# Patient Record
Sex: Female | Born: 1935 | Race: White | Hispanic: No
Health system: Southern US, Community
[De-identification: ages and names within clinical notes are randomized; demographics above are authoritative.]

---

## 1997-09-23 ENCOUNTER — Ambulatory Visit (HOSPITAL_COMMUNITY): Admission: RE | Admit: 1997-09-23 | Discharge: 1997-09-24 | Payer: Self-pay | Admitting: *Deleted

## 1998-06-03 ENCOUNTER — Ambulatory Visit (HOSPITAL_COMMUNITY): Admission: RE | Admit: 1998-06-03 | Discharge: 1998-06-03 | Payer: Self-pay | Admitting: *Deleted

## 1998-06-13 ENCOUNTER — Encounter: Admission: RE | Admit: 1998-06-13 | Discharge: 1998-09-11 | Payer: Self-pay | Admitting: Family Medicine

## 1998-07-06 ENCOUNTER — Ambulatory Visit (HOSPITAL_COMMUNITY): Admission: RE | Admit: 1998-07-06 | Discharge: 1998-07-06 | Payer: Self-pay | Admitting: *Deleted

## 1998-09-12 ENCOUNTER — Encounter: Admission: RE | Admit: 1998-09-12 | Discharge: 1998-12-11 | Payer: Self-pay | Admitting: Radiation Oncology

## 1998-12-13 ENCOUNTER — Encounter: Admission: RE | Admit: 1998-12-13 | Discharge: 1999-03-13 | Payer: Self-pay | Admitting: Radiation Oncology

## 1999-01-31 ENCOUNTER — Ambulatory Visit (HOSPITAL_COMMUNITY): Admission: RE | Admit: 1999-01-31 | Discharge: 1999-01-31 | Payer: Self-pay | Admitting: Pulmonary Disease

## 2000-02-01 ENCOUNTER — Encounter: Payer: Self-pay | Admitting: General Surgery

## 2000-02-01 ENCOUNTER — Ambulatory Visit (HOSPITAL_COMMUNITY): Admission: RE | Admit: 2000-02-01 | Discharge: 2000-02-01 | Payer: Self-pay | Admitting: General Surgery

## 2001-02-03 ENCOUNTER — Encounter: Payer: Self-pay | Admitting: General Surgery

## 2001-02-03 ENCOUNTER — Ambulatory Visit (HOSPITAL_COMMUNITY): Admission: RE | Admit: 2001-02-03 | Discharge: 2001-02-03 | Payer: Self-pay | Admitting: General Surgery

## 2002-02-10 ENCOUNTER — Encounter: Admission: RE | Admit: 2002-02-10 | Discharge: 2002-02-10 | Payer: Self-pay | Admitting: General Surgery

## 2002-02-10 ENCOUNTER — Encounter: Payer: Self-pay | Admitting: General Surgery

## 2003-02-12 ENCOUNTER — Encounter: Admission: RE | Admit: 2003-02-12 | Discharge: 2003-02-12 | Payer: Self-pay | Admitting: General Surgery

## 2003-02-12 ENCOUNTER — Encounter: Payer: Self-pay | Admitting: General Surgery

## 2004-02-15 ENCOUNTER — Encounter: Admission: RE | Admit: 2004-02-15 | Discharge: 2004-02-15 | Payer: Self-pay | Admitting: General Surgery

## 2005-03-16 ENCOUNTER — Encounter: Admission: RE | Admit: 2005-03-16 | Discharge: 2005-03-16 | Payer: Self-pay | Admitting: Internal Medicine

## 2005-10-27 ENCOUNTER — Ambulatory Visit: Payer: Self-pay | Admitting: Internal Medicine

## 2006-03-22 ENCOUNTER — Encounter: Admission: RE | Admit: 2006-03-22 | Discharge: 2006-03-22 | Payer: Self-pay | Admitting: Internal Medicine

## 2012-02-21 ENCOUNTER — Inpatient Hospital Stay: Payer: Self-pay | Admitting: Orthopedic Surgery

## 2012-02-21 LAB — CBC WITH DIFFERENTIAL/PLATELET
Basophil #: 0 10*3/uL (ref 0.0–0.1)
Basophil %: 0.2 %
Eosinophil #: 0 10*3/uL (ref 0.0–0.7)
Eosinophil %: 0 %
HCT: 36.3 % (ref 35.0–47.0)
HGB: 11.9 g/dL — ABNORMAL LOW (ref 12.0–16.0)
Lymphocyte #: 0.5 10*3/uL — ABNORMAL LOW (ref 1.0–3.6)
Lymphocyte %: 2.9 %
MCH: 31.4 pg (ref 26.0–34.0)
MCHC: 32.7 g/dL (ref 32.0–36.0)
MCV: 96 fL (ref 80–100)
Monocyte #: 1.2 x10 3/mm — ABNORMAL HIGH (ref 0.2–0.9)
Monocyte %: 6.2 %
Neutrophil #: 16.9 10*3/uL — ABNORMAL HIGH (ref 1.4–6.5)
Neutrophil %: 90.7 %
Platelet: 258 10*3/uL (ref 150–440)
RBC: 3.78 10*6/uL — ABNORMAL LOW (ref 3.80–5.20)
RDW: 12.7 % (ref 11.5–14.5)
WBC: 18.6 10*3/uL — ABNORMAL HIGH (ref 3.6–11.0)

## 2012-02-21 LAB — BASIC METABOLIC PANEL
Anion Gap: 11 (ref 7–16)
BUN: 32 mg/dL — ABNORMAL HIGH (ref 7–18)
Calcium, Total: 8.8 mg/dL (ref 8.5–10.1)
Chloride: 106 mmol/L (ref 98–107)
Co2: 22 mmol/L (ref 21–32)
Creatinine: 1.79 mg/dL — ABNORMAL HIGH (ref 0.60–1.30)
EGFR (African American): 30 — ABNORMAL LOW
EGFR (Non-African Amer.): 26 — ABNORMAL LOW
Glucose: 150 mg/dL — ABNORMAL HIGH (ref 65–99)
Osmolality: 287 (ref 275–301)
Potassium: 3.8 mmol/L (ref 3.5–5.1)
Sodium: 139 mmol/L (ref 136–145)

## 2012-02-21 LAB — URINALYSIS, COMPLETE
Bilirubin,UR: NEGATIVE
Blood: NEGATIVE
Glucose,UR: NEGATIVE mg/dL (ref 0–75)
Hyaline Cast: 1
Ketone: NEGATIVE
Nitrite: NEGATIVE
Ph: 7 (ref 4.5–8.0)
Protein: NEGATIVE
RBC,UR: <1 /HPF (ref 0–5)
Specific Gravity: 1.014 (ref 1.003–1.030)
Squamous Epithelial: 2
WBC UR: 15 /HPF (ref 0–5)

## 2012-02-21 LAB — CK: CK, Total: 130 U/L (ref 21–215)

## 2012-02-21 LAB — PROTIME-INR
INR: 1.2
Prothrombin Time: 15.5 secs — ABNORMAL HIGH (ref 11.5–14.7)

## 2012-02-21 LAB — APTT: Activated PTT: 23.1 secs — ABNORMAL LOW (ref 23.6–35.9)

## 2012-02-21 LAB — TROPONIN I: Troponin-I: <0.02 ng/mL

## 2012-02-22 LAB — BASIC METABOLIC PANEL
Anion Gap: 12 (ref 7–16)
BUN: 31 mg/dL — ABNORMAL HIGH (ref 7–18)
Calcium, Total: 8.5 mg/dL (ref 8.5–10.1)
Chloride: 106 mmol/L (ref 98–107)
Co2: 21 mmol/L (ref 21–32)
Creatinine: 1.95 mg/dL — ABNORMAL HIGH (ref 0.60–1.30)
EGFR (African American): 27 — ABNORMAL LOW
EGFR (Non-African Amer.): 23 — ABNORMAL LOW
Glucose: 103 mg/dL — ABNORMAL HIGH (ref 65–99)
Osmolality: 284 (ref 275–301)
Potassium: 4.1 mmol/L (ref 3.5–5.1)
Sodium: 139 mmol/L (ref 136–145)

## 2012-02-22 LAB — CBC WITH DIFFERENTIAL/PLATELET
Basophil #: 0.1 10*3/uL (ref 0.0–0.1)
Basophil %: 0.4 %
Eosinophil #: 0 10*3/uL (ref 0.0–0.7)
Eosinophil %: 0.3 %
HCT: 31.5 % — ABNORMAL LOW (ref 35.0–47.0)
HGB: 11 g/dL — ABNORMAL LOW (ref 12.0–16.0)
Lymphocyte #: 1.4 10*3/uL (ref 1.0–3.6)
Lymphocyte %: 9.3 %
MCH: 33.4 pg (ref 26.0–34.0)
MCHC: 35 g/dL (ref 32.0–36.0)
MCV: 95 fL (ref 80–100)
Monocyte #: 1.2 x10 3/mm — ABNORMAL HIGH (ref 0.2–0.9)
Monocyte %: 8.2 %
Neutrophil #: 12 10*3/uL — ABNORMAL HIGH (ref 1.4–6.5)
Neutrophil %: 81.8 %
Platelet: 237 10*3/uL (ref 150–440)
RBC: 3.31 10*6/uL — ABNORMAL LOW (ref 3.80–5.20)
RDW: 12.6 % (ref 11.5–14.5)
WBC: 14.6 10*3/uL — ABNORMAL HIGH (ref 3.6–11.0)

## 2012-02-23 LAB — BASIC METABOLIC PANEL
Anion Gap: 10 (ref 7–16)
BUN: 32 mg/dL — ABNORMAL HIGH (ref 7–18)
Calcium, Total: 7.4 mg/dL — ABNORMAL LOW (ref 8.5–10.1)
Chloride: 109 mmol/L — ABNORMAL HIGH (ref 98–107)
Co2: 21 mmol/L (ref 21–32)
Creatinine: 2.13 mg/dL — ABNORMAL HIGH (ref 0.60–1.30)
EGFR (African American): 24 — ABNORMAL LOW
EGFR (Non-African Amer.): 21 — ABNORMAL LOW
Glucose: 110 mg/dL — ABNORMAL HIGH (ref 65–99)
Osmolality: 287 (ref 275–301)
Potassium: 3.7 mmol/L (ref 3.5–5.1)
Sodium: 140 mmol/L (ref 136–145)

## 2012-02-23 LAB — HEMOGLOBIN: HGB: 9 g/dL — ABNORMAL LOW (ref 12.0–16.0)

## 2012-02-23 LAB — PLATELET COUNT: Platelet: 187 10*3/uL (ref 150–440)

## 2012-02-23 LAB — TSH: Thyroid Stimulating Horm: 2.1 u[IU]/mL

## 2012-02-24 LAB — BASIC METABOLIC PANEL
Anion Gap: 11 (ref 7–16)
BUN: 33 mg/dL — ABNORMAL HIGH (ref 7–18)
Calcium, Total: 7.4 mg/dL — ABNORMAL LOW (ref 8.5–10.1)
Chloride: 113 mmol/L — ABNORMAL HIGH (ref 98–107)
Co2: 21 mmol/L (ref 21–32)
Creatinine: 2.35 mg/dL — ABNORMAL HIGH (ref 0.60–1.30)
EGFR (African American): 21 — ABNORMAL LOW
EGFR (Non-African Amer.): 18 — ABNORMAL LOW
Glucose: 94 mg/dL (ref 65–99)
Osmolality: 296 (ref 275–301)
Potassium: 3.2 mmol/L — ABNORMAL LOW (ref 3.5–5.1)
Sodium: 145 mmol/L (ref 136–145)

## 2012-02-24 LAB — CK TOTAL AND CKMB (NOT AT ARMC)
CK, Total: 1683 U/L — ABNORMAL HIGH (ref 21–215)
CK, Total: 1472 U/L — ABNORMAL HIGH (ref 21–215)
CK-MB: 6 ng/mL — ABNORMAL HIGH (ref 0.5–3.6)
CK-MB: 4.4 ng/mL — ABNORMAL HIGH (ref 0.5–3.6)

## 2012-02-24 LAB — PROTEIN / CREATININE RATIO, URINE
Creatinine, Urine: 143.7 mg/dL — ABNORMAL HIGH (ref 30.0–125.0)
Protein, Random Urine: 75 mg/dL — ABNORMAL HIGH (ref 0–12)
Protein/Creat. Ratio: 522 mg/gCREAT — ABNORMAL HIGH (ref 0–200)

## 2012-02-24 LAB — TROPONIN I
Troponin-I: 0.05 ng/mL
Troponin-I: 0.04 ng/mL

## 2012-02-24 LAB — MAGNESIUM: Magnesium: 1.4 mg/dL — ABNORMAL LOW

## 2012-02-24 LAB — URINE CULTURE

## 2012-02-24 LAB — HEMOGLOBIN: HGB: 8.7 g/dL — ABNORMAL LOW (ref 12.0–16.0)

## 2012-02-25 LAB — CBC WITH DIFFERENTIAL/PLATELET
Basophil #: 0.1 10*3/uL (ref 0.0–0.1)
Basophil %: 0.7 %
Eosinophil #: 0.4 10*3/uL (ref 0.0–0.7)
Eosinophil %: 3.4 %
HCT: 23.8 % — ABNORMAL LOW (ref 35.0–47.0)
HGB: 8.2 g/dL — ABNORMAL LOW (ref 12.0–16.0)
Lymphocyte #: 1.6 10*3/uL (ref 1.0–3.6)
Lymphocyte %: 12.3 %
MCH: 33.1 pg (ref 26.0–34.0)
MCHC: 34.5 g/dL (ref 32.0–36.0)
MCV: 96 fL (ref 80–100)
Monocyte #: 1.8 x10 3/mm — ABNORMAL HIGH (ref 0.2–0.9)
Monocyte %: 14.2 %
Neutrophil #: 8.9 10*3/uL — ABNORMAL HIGH (ref 1.4–6.5)
Neutrophil %: 69.4 %
Platelet: 181 10*3/uL (ref 150–440)
RBC: 2.48 10*6/uL — ABNORMAL LOW (ref 3.80–5.20)
RDW: 13 % (ref 11.5–14.5)
WBC: 12.8 10*3/uL — ABNORMAL HIGH (ref 3.6–11.0)

## 2012-02-25 LAB — TROPONIN I: Troponin-I: 0.03 ng/mL

## 2012-02-25 LAB — BASIC METABOLIC PANEL
Anion Gap: 10 (ref 7–16)
BUN: 31 mg/dL — ABNORMAL HIGH (ref 7–18)
Calcium, Total: 7.7 mg/dL — ABNORMAL LOW (ref 8.5–10.1)
Chloride: 114 mmol/L — ABNORMAL HIGH (ref 98–107)
Co2: 21 mmol/L (ref 21–32)
Creatinine: 1.96 mg/dL — ABNORMAL HIGH (ref 0.60–1.30)
EGFR (African American): 27 — ABNORMAL LOW
EGFR (Non-African Amer.): 23 — ABNORMAL LOW
Glucose: 95 mg/dL (ref 65–99)
Osmolality: 295 (ref 275–301)
Potassium: 4.1 mmol/L (ref 3.5–5.1)
Sodium: 145 mmol/L (ref 136–145)

## 2012-02-25 LAB — MAGNESIUM: Magnesium: 2.3 mg/dL

## 2012-02-25 LAB — PROTEIN ELECTROPHORESIS(ARMC)

## 2012-02-25 LAB — CK TOTAL AND CKMB (NOT AT ARMC)
CK, Total: 1173 U/L — ABNORMAL HIGH (ref 21–215)
CK-MB: 3.4 ng/mL (ref 0.5–3.6)

## 2012-02-25 LAB — UR PROT ELECTROPHORESIS, URINE RANDOM

## 2012-02-26 LAB — BASIC METABOLIC PANEL
Anion Gap: 8 (ref 7–16)
BUN: 27 mg/dL — ABNORMAL HIGH (ref 7–18)
Calcium, Total: 8 mg/dL — ABNORMAL LOW (ref 8.5–10.1)
Chloride: 118 mmol/L — ABNORMAL HIGH (ref 98–107)
Co2: 22 mmol/L (ref 21–32)
Creatinine: 1.92 mg/dL — ABNORMAL HIGH (ref 0.60–1.30)
EGFR (African American): 27 — ABNORMAL LOW
EGFR (Non-African Amer.): 23 — ABNORMAL LOW
Glucose: 108 mg/dL — ABNORMAL HIGH (ref 65–99)
Osmolality: 300 (ref 275–301)
Potassium: 4.4 mmol/L (ref 3.5–5.1)
Sodium: 148 mmol/L — ABNORMAL HIGH (ref 136–145)

## 2012-02-27 LAB — BASIC METABOLIC PANEL
Anion Gap: 9 (ref 7–16)
BUN: 23 mg/dL — ABNORMAL HIGH (ref 7–18)
Calcium, Total: 7.8 mg/dL — ABNORMAL LOW (ref 8.5–10.1)
Chloride: 111 mmol/L — ABNORMAL HIGH (ref 98–107)
Co2: 21 mmol/L (ref 21–32)
Creatinine: 1.7 mg/dL — ABNORMAL HIGH (ref 0.60–1.30)
EGFR (African American): 32 — ABNORMAL LOW
EGFR (Non-African Amer.): 27 — ABNORMAL LOW
Glucose: 102 mg/dL — ABNORMAL HIGH (ref 65–99)
Osmolality: 285 (ref 275–301)
Potassium: 4.5 mmol/L (ref 3.5–5.1)
Sodium: 141 mmol/L (ref 136–145)

## 2012-02-29 LAB — PATHOLOGY REPORT

## 2012-03-14 ENCOUNTER — Ambulatory Visit: Payer: Self-pay | Admitting: Internal Medicine

## 2012-03-24 ENCOUNTER — Encounter: Payer: Self-pay | Admitting: Nurse Practitioner

## 2012-03-24 ENCOUNTER — Encounter: Payer: Self-pay | Admitting: Cardiothoracic Surgery

## 2012-04-05 ENCOUNTER — Inpatient Hospital Stay: Payer: Self-pay | Admitting: Student

## 2012-04-05 LAB — PROTIME-INR
INR: 1.3
Prothrombin Time: 16.9 secs — ABNORMAL HIGH (ref 11.5–14.7)

## 2012-04-05 LAB — COMPREHENSIVE METABOLIC PANEL
Albumin: 2.2 g/dL — ABNORMAL LOW (ref 3.4–5.0)
Alkaline Phosphatase: 103 U/L (ref 50–136)
BUN: 232 mg/dL — ABNORMAL HIGH (ref 7–18)
Bilirubin,Total: 0.4 mg/dL (ref 0.2–1.0)
Calcium, Total: 9.4 mg/dL (ref 8.5–10.1)
Chloride: >128 mmol/L — ABNORMAL HIGH (ref 98–107)
Co2: 19 mmol/L — ABNORMAL LOW (ref 21–32)
Creatinine: 8.9 mg/dL — ABNORMAL HIGH (ref 0.60–1.30)
EGFR (African American): 4 — ABNORMAL LOW
EGFR (Non-African Amer.): 4 — ABNORMAL LOW
Glucose: 164 mg/dL — ABNORMAL HIGH (ref 65–99)
Potassium: 5.5 mmol/L — ABNORMAL HIGH (ref 3.5–5.1)
SGOT(AST): 53 U/L — ABNORMAL HIGH (ref 15–37)
SGPT (ALT): 23 U/L (ref 12–78)
Sodium: >160 mmol/L (ref 136–145)
Total Protein: 8.7 g/dL — ABNORMAL HIGH (ref 6.4–8.2)

## 2012-04-05 LAB — TROPONIN I: Troponin-I: 0.32 ng/mL — ABNORMAL HIGH

## 2012-04-05 LAB — CBC
HCT: 31.5 % — ABNORMAL LOW (ref 35.0–47.0)
HGB: 9.6 g/dL — ABNORMAL LOW (ref 12.0–16.0)
MCH: 29.7 pg (ref 26.0–34.0)
MCHC: 30.4 g/dL — ABNORMAL LOW (ref 32.0–36.0)
MCV: 98 fL (ref 80–100)
Platelet: 375 10*3/uL (ref 150–440)
RBC: 3.22 10*6/uL — ABNORMAL LOW (ref 3.80–5.20)
RDW: 14.9 % — ABNORMAL HIGH (ref 11.5–14.5)
WBC: 24.8 10*3/uL — ABNORMAL HIGH (ref 3.6–11.0)

## 2012-04-05 LAB — URINALYSIS, COMPLETE
Bilirubin,UR: NEGATIVE
Glucose,UR: NEGATIVE mg/dL (ref 0–75)
Ketone: NEGATIVE
Nitrite: NEGATIVE
Ph: 7 (ref 4.5–8.0)
Protein: >=500
RBC,UR: 226 /HPF (ref 0–5)
Specific Gravity: 1.024 (ref 1.003–1.030)
Squamous Epithelial: 43
Transitional Epi: 2
WBC UR: 4368 /HPF (ref 0–5)

## 2012-04-05 LAB — BASIC METABOLIC PANEL
BUN: 202 mg/dL — ABNORMAL HIGH (ref 7–18)
Calcium, Total: 8.1 mg/dL — ABNORMAL LOW (ref 8.5–10.1)
Chloride: >128 mmol/L — ABNORMAL HIGH (ref 98–107)
Co2: 19 mmol/L — ABNORMAL LOW (ref 21–32)
Creatinine: 8.4 mg/dL — ABNORMAL HIGH (ref 0.60–1.30)
EGFR (African American): 5 — ABNORMAL LOW
EGFR (Non-African Amer.): 4 — ABNORMAL LOW
Glucose: 138 mg/dL — ABNORMAL HIGH (ref 65–99)
Potassium: 5.1 mmol/L (ref 3.5–5.1)
Sodium: >160 mmol/L (ref 136–145)

## 2012-04-06 LAB — CBC WITH DIFFERENTIAL/PLATELET
Basophil #: 0.1 10*3/uL (ref 0.0–0.1)
Basophil %: 0.3 %
Eosinophil #: 0.3 10*3/uL (ref 0.0–0.7)
Eosinophil %: 1.4 %
HCT: 27.2 % — ABNORMAL LOW (ref 35.0–47.0)
HGB: 8.6 g/dL — ABNORMAL LOW (ref 12.0–16.0)
Lymphocyte #: 2.2 10*3/uL (ref 1.0–3.6)
Lymphocyte %: 8.9 %
MCH: 31.3 pg (ref 26.0–34.0)
MCHC: 31.5 g/dL — ABNORMAL LOW (ref 32.0–36.0)
MCV: 99 fL (ref 80–100)
Monocyte #: 1.3 x10 3/mm — ABNORMAL HIGH (ref 0.2–0.9)
Monocyte %: 5.3 %
Neutrophil #: 20.4 10*3/uL — ABNORMAL HIGH (ref 1.4–6.5)
Neutrophil %: 84.1 %
Platelet: 297 10*3/uL (ref 150–440)
RBC: 2.74 10*6/uL — ABNORMAL LOW (ref 3.80–5.20)
RDW: 14.5 % (ref 11.5–14.5)
WBC: 24.2 10*3/uL — ABNORMAL HIGH (ref 3.6–11.0)

## 2012-04-06 LAB — BASIC METABOLIC PANEL
BUN: 211 mg/dL — ABNORMAL HIGH (ref 7–18)
BUN: 204 mg/dL — ABNORMAL HIGH (ref 7–18)
Calcium, Total: 8.3 mg/dL — ABNORMAL LOW (ref 8.5–10.1)
Calcium, Total: 8.2 mg/dL — ABNORMAL LOW (ref 8.5–10.1)
Chloride: >128 mmol/L — ABNORMAL HIGH (ref 98–107)
Chloride: >128 mmol/L — ABNORMAL HIGH (ref 98–107)
Co2: 17 mmol/L — ABNORMAL LOW (ref 21–32)
Co2: 18 mmol/L — ABNORMAL LOW (ref 21–32)
Creatinine: 8.49 mg/dL — ABNORMAL HIGH (ref 0.60–1.30)
Creatinine: 8.34 mg/dL — ABNORMAL HIGH (ref 0.60–1.30)
EGFR (African American): 5 — ABNORMAL LOW
EGFR (African American): 5 — ABNORMAL LOW
EGFR (Non-African Amer.): 4 — ABNORMAL LOW
EGFR (Non-African Amer.): 4 — ABNORMAL LOW
Glucose: 100 mg/dL — ABNORMAL HIGH (ref 65–99)
Glucose: 153 mg/dL — ABNORMAL HIGH (ref 65–99)
Potassium: 4 mmol/L (ref 3.5–5.1)
Potassium: 4.3 mmol/L (ref 3.5–5.1)
Sodium: >160 mmol/L (ref 136–145)
Sodium: >160 mmol/L (ref 136–145)

## 2012-04-06 LAB — TROPONIN I: Troponin-I: 0.26 ng/mL — ABNORMAL HIGH

## 2012-04-07 LAB — CBC WITH DIFFERENTIAL/PLATELET
Bands: 1 %
Eosinophil: 5 %
HCT: 24.1 % — ABNORMAL LOW (ref 35.0–47.0)
HGB: 7.5 g/dL — ABNORMAL LOW (ref 12.0–16.0)
Lymphocytes: 13 %
MCH: 29.9 pg (ref 26.0–34.0)
MCHC: 31 g/dL — ABNORMAL LOW (ref 32.0–36.0)
MCV: 97 fL (ref 80–100)
Monocytes: 5 %
NRBC/100 WBC: 3 /
Platelet: 268 10*3/uL (ref 150–440)
RBC: 2.49 10*6/uL — ABNORMAL LOW (ref 3.80–5.20)
RDW: 14.7 % — ABNORMAL HIGH (ref 11.5–14.5)
Segmented Neutrophils: 76 %
WBC: 20 10*3/uL — ABNORMAL HIGH (ref 3.6–11.0)

## 2012-04-07 LAB — BASIC METABOLIC PANEL
Co2: 17 mmol/L — ABNORMAL LOW (ref 21–32)
Creatinine: 8.35 mg/dL — ABNORMAL HIGH (ref 0.60–1.30)
EGFR (African American): 5 — ABNORMAL LOW
Sodium: >160 mmol/L (ref 136–145)

## 2012-04-11 LAB — URINE CULTURE

## 2012-04-11 LAB — CULTURE, BLOOD (SINGLE)

## 2012-04-13 ENCOUNTER — Ambulatory Visit: Payer: Self-pay | Admitting: Internal Medicine

## 2012-04-13 DEATH — deceased

## 2014-03-15 IMAGING — US US RENAL KIDNEY
1 series · 14 of 25 positions shown · non-contrast
Comparison: none

REASON FOR EXAM: acute renal failure
COMMENTS:

PROCEDURE:     US  - US KIDNEY  - February 24, 2012  [DATE]
RESULT:     Comparison: None.
TECHNIQUE: Multiple grayscale and color Doppler images were obtained of the
kidneys.

[Series 1: us renal kidney · 0.25mm/px · 14 of 33 slices shown]
[im 1/33]
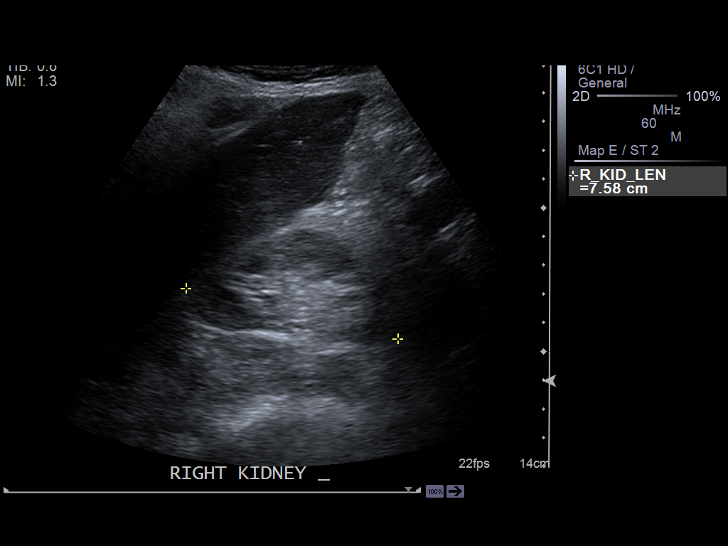
[im 3/33]
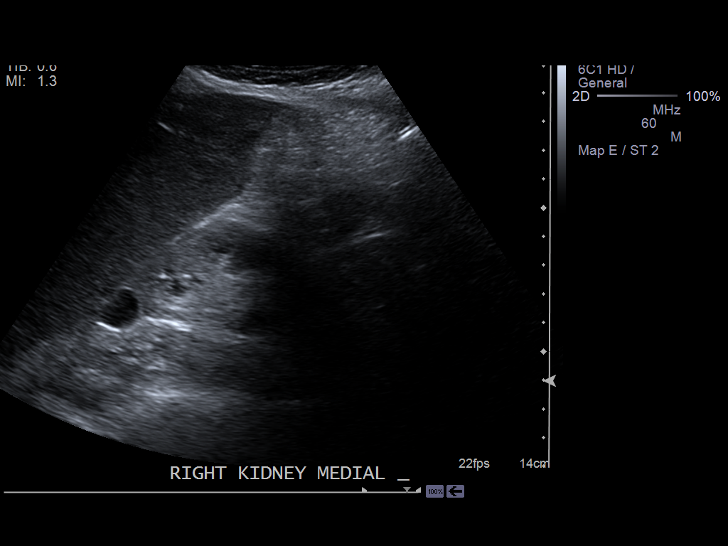
[im 6/33]
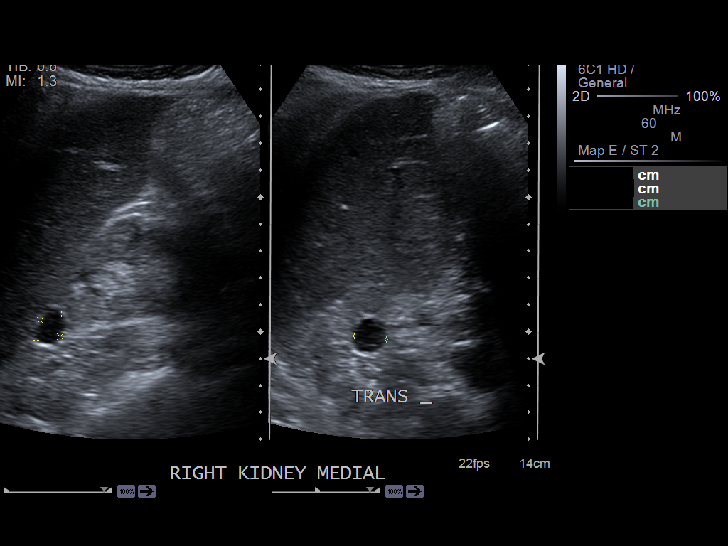
[im 9/33]
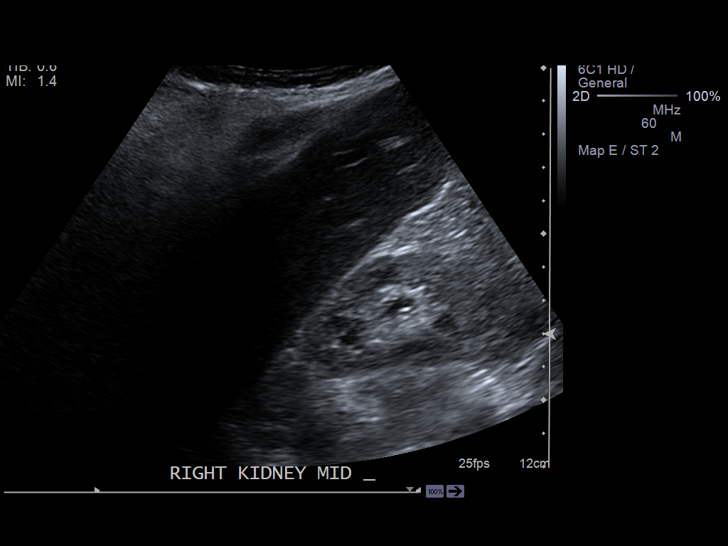
[im 11/33]
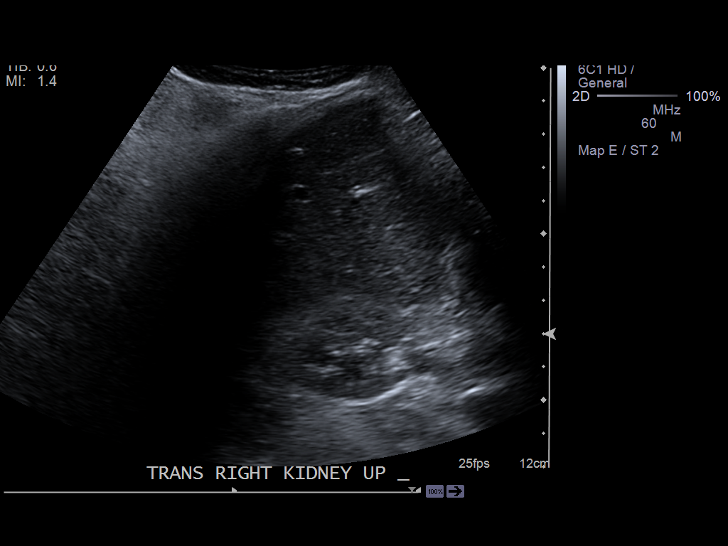
[im 13/33]
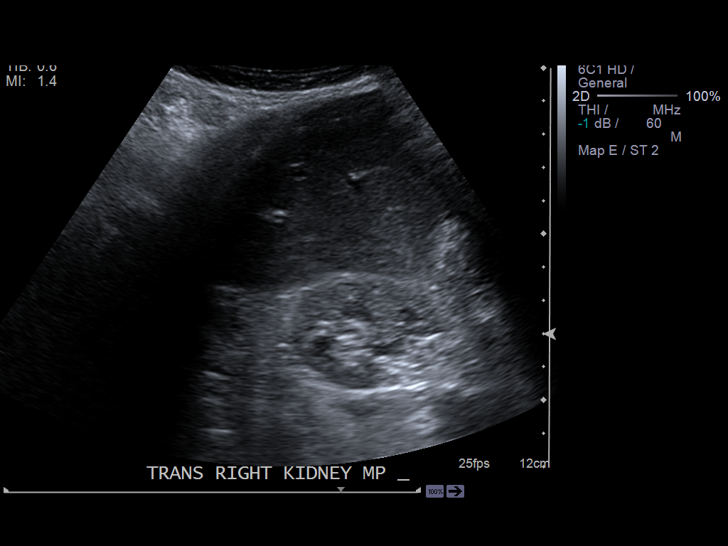
[im 15/33]
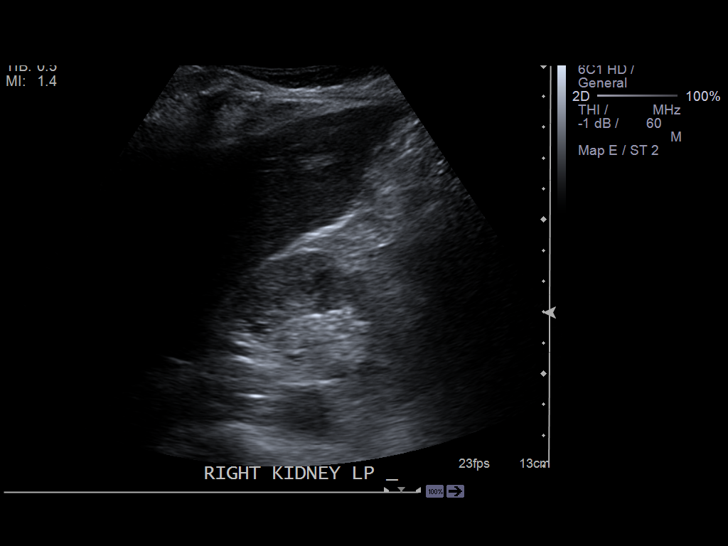
[im 18/33]
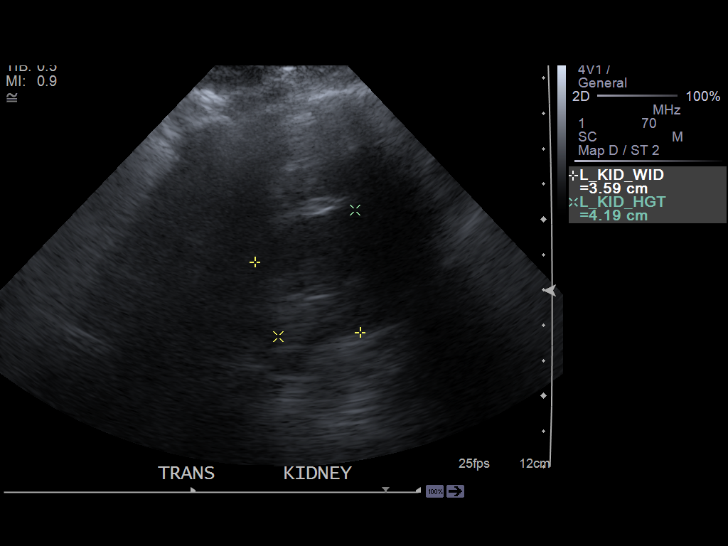
[im 21/33]
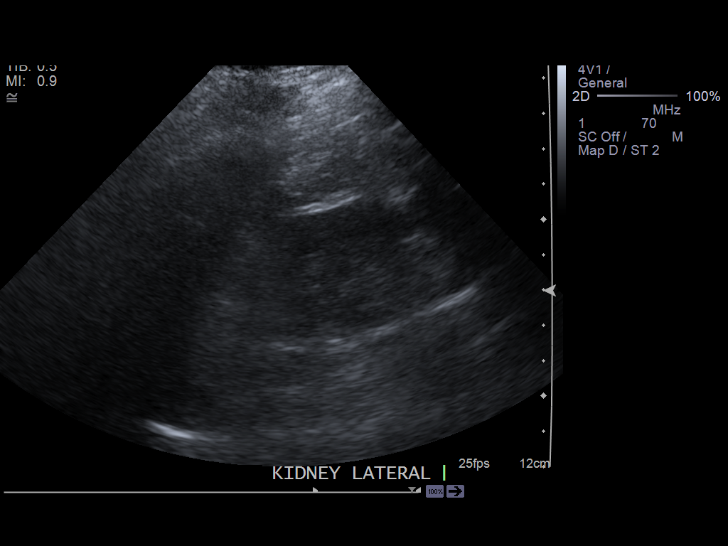
[im 22/33]
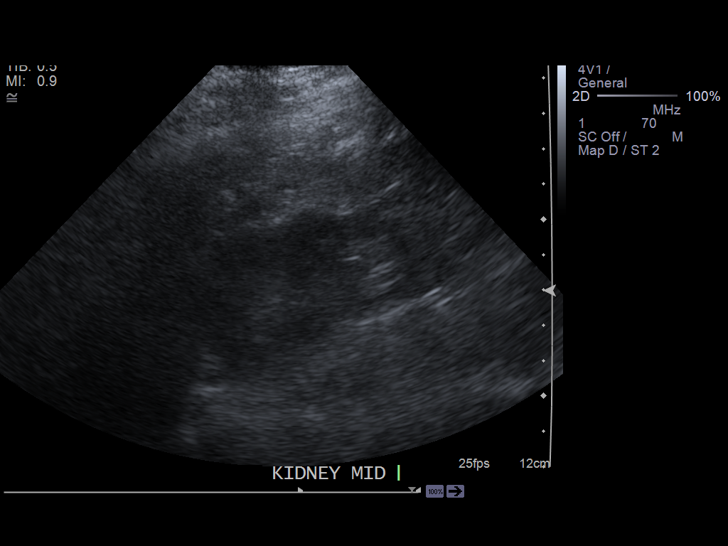
[im 25/33]
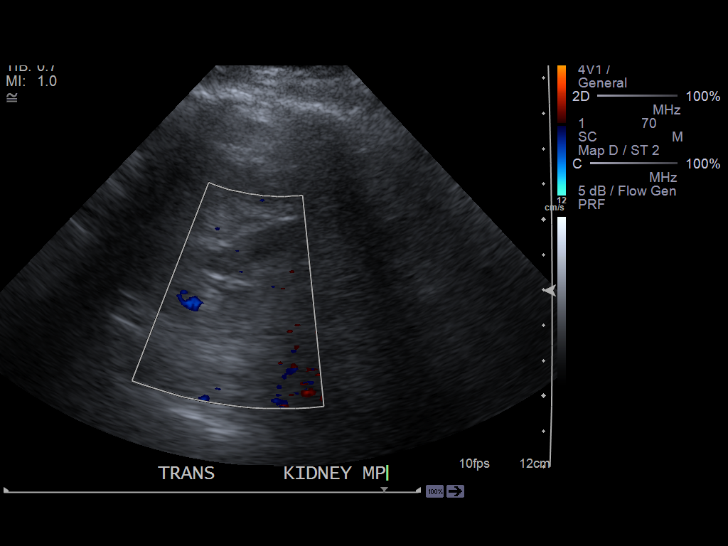
[im 27/33]
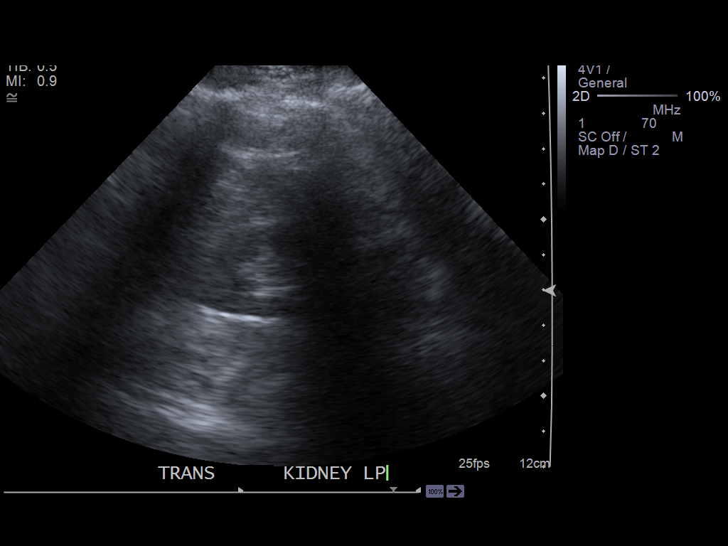
[im 30/33]
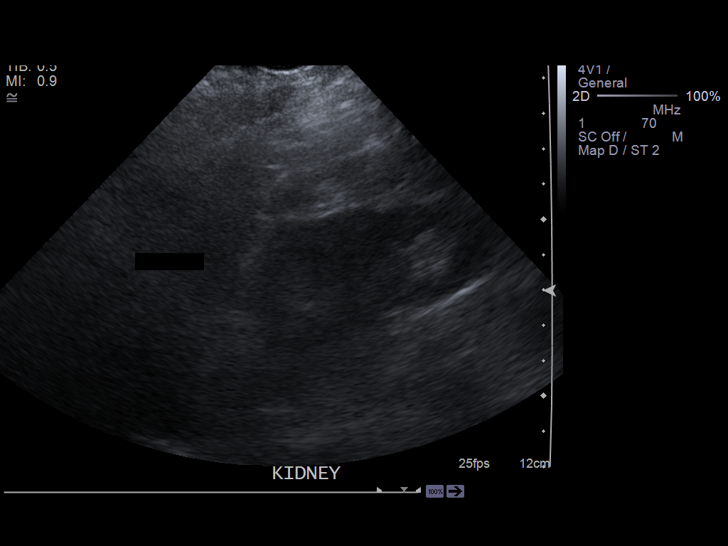
[im 33/33]
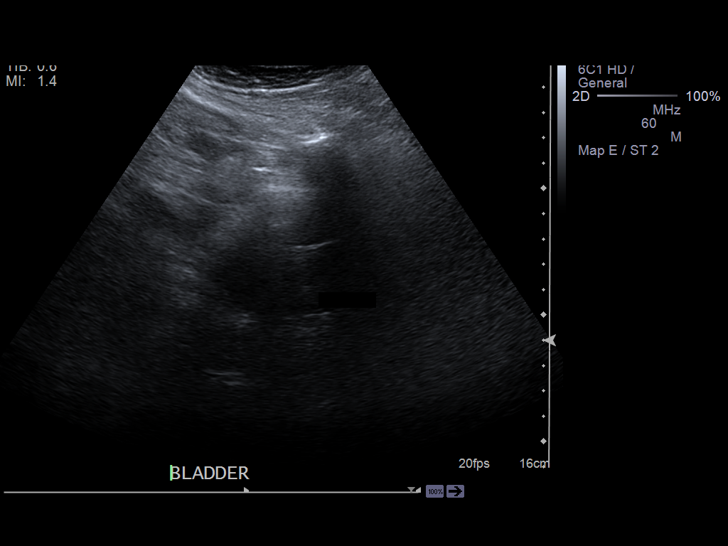

[14 of 25 positions shown; findings below may reference images not displayed]

FINDINGS: The right kidney measures 7.6 x 4.1 x 4.3 cm. The left kidney measures 7.5 x
3.6 x 4.2 cm. Evaluation is slightly limited secondary to difficulty with
patient mobility and patient cooperation. There is no hydronephrosis. There
is a small cyst in the superior pole the right kidney which measures 1.4 x
1.2 x 1.0 cm. The bladder is decompressed with a Foley catheter.
IMPRESSION: No hydronephrosis. The kidneys are somewhat small in size.

[REDACTED]

## 2014-04-25 IMAGING — CR DG CHEST 1V PORT
1 series · 1 of 1 positions shown · non-contrast
Comparison: none

REASON FOR EXAM: weak
COMMENTS:

[ap]
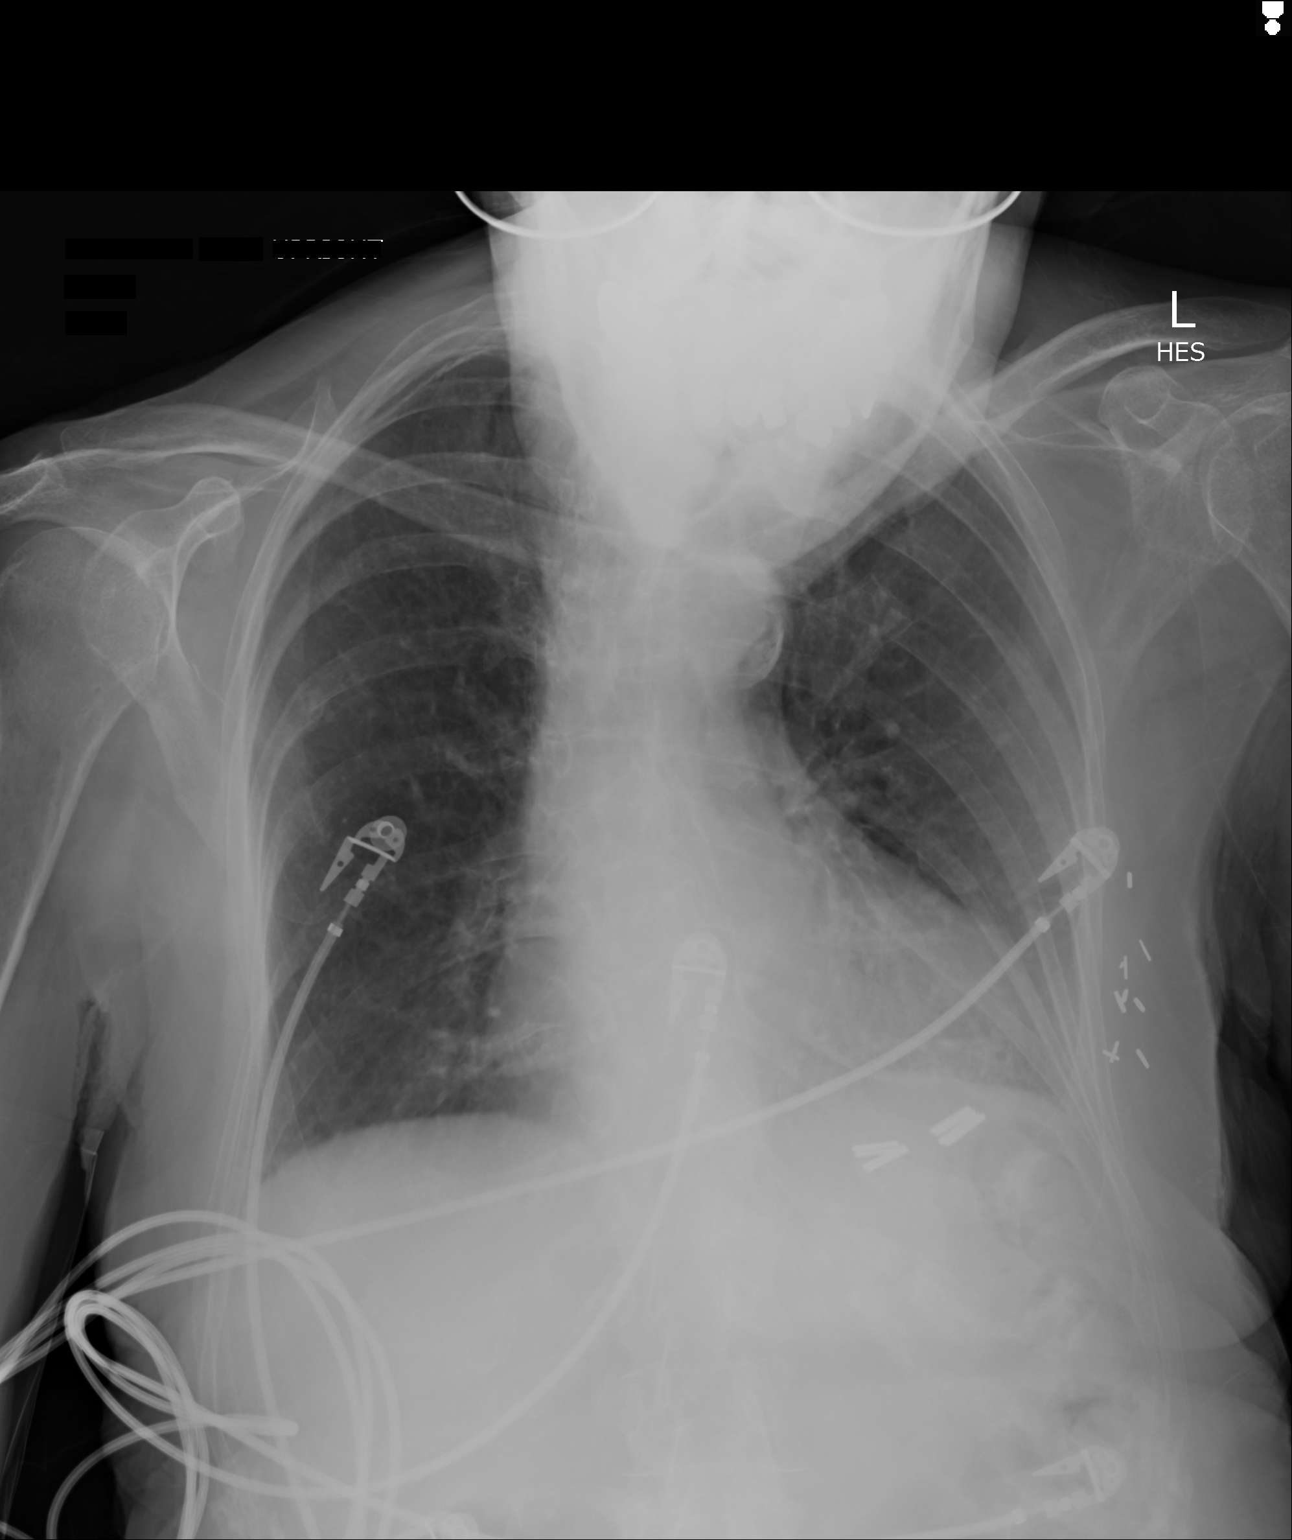

[1 of 1 positions shown; findings below may reference images not displayed]

PROCEDURE:     DXR - DXR PORTABLE CHEST SINGLE VIEW  - April 05, 2012  [DATE]

RESULT:     Comparison is made to the study 21 February, 2012. Surgical
clips are seen over the left chest wall. Chin projects over the apices. The
cardiac silhouette is enlarged. Emphysematous changes are present. There
appears to be an old posterior lateral right rib fracture in the sixth rib
on the right side. Atherosclerotic calcification is present in the aorta.
IMPRESSION: 1. Stable cardiomegaly. Emphysematous lung disease.

[REDACTED]

## 2014-04-26 IMAGING — CT CT HEAD WITHOUT CONTRAST
4 of 5 series · 16 of 30 positions shown, 17 images · non-contrast
Comparison: none

REASON FOR EXAM: altered mental Anton.
COMMENTS:

PROCEDURE:     CT  - CT HEAD WITHOUT CONTRAST  - April 06, 2012  [DATE]
RESULT:
TECHNIQUE: Helical noncontrasted 5 mm sections were obtained from the skull
base to the vertex.

[Series 2: without · axial · non-contrast · 0.44mm/px · z∈[-172,-78]mm · 4 of 33 slices shown (1 of 2)]
[im 7/33  brain]
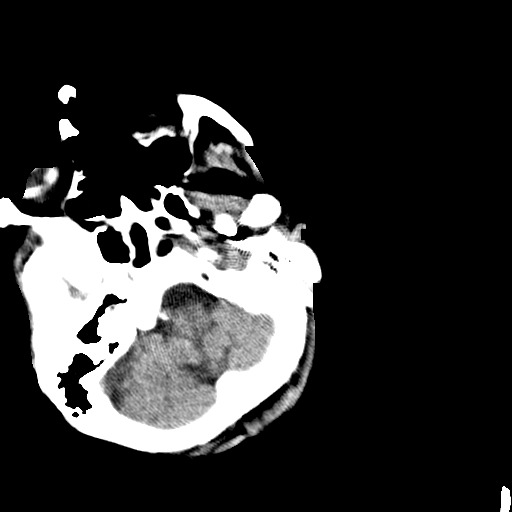
[im 13/33  brain]
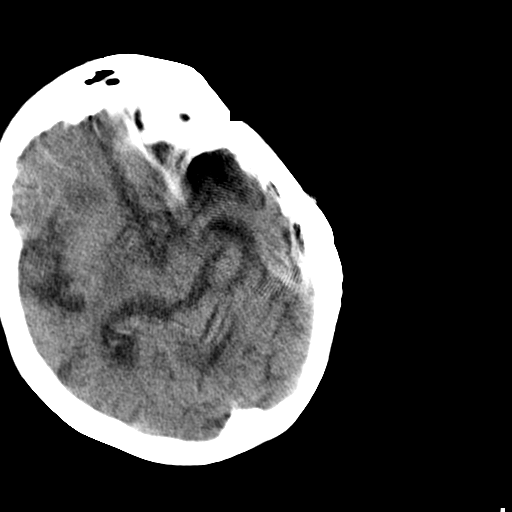
[im 20/33  brain]
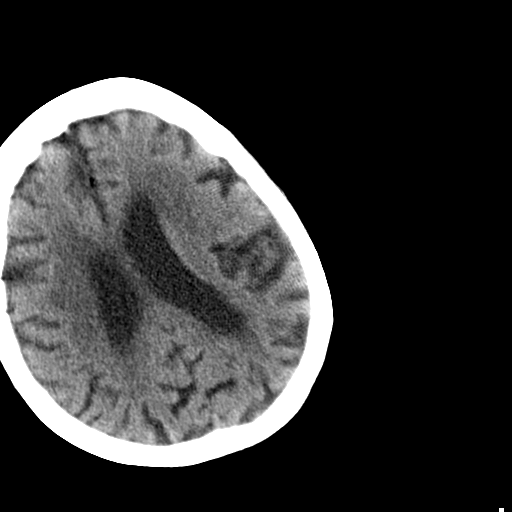
[im 26/33  brain]
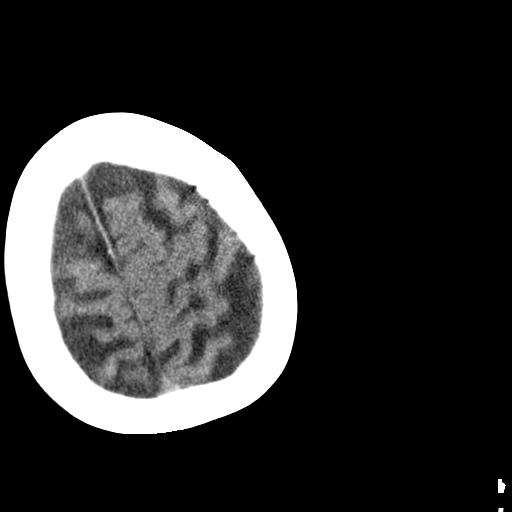

[Series 3: bone · axial · 0.44mm/px · z∈[-172,-78]mm · 4 of 33 slices shown]
[im 7/33  bone]
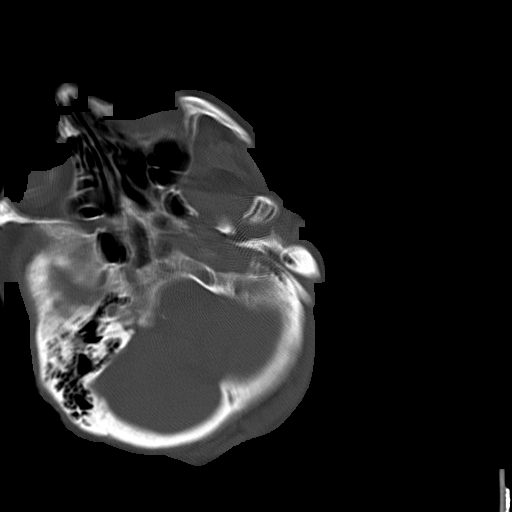
[im 13/33  bone]
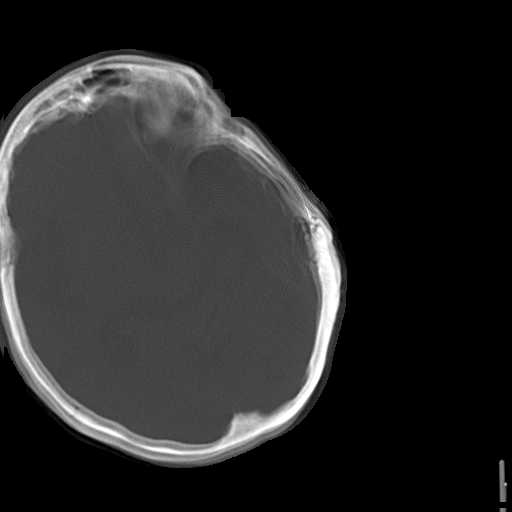
[im 20/33  bone]
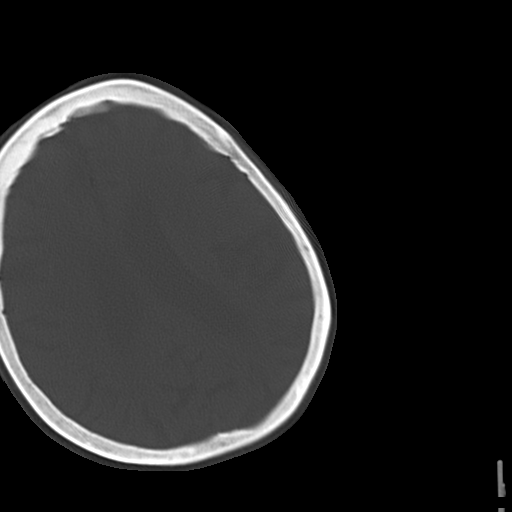
[im 26/33  bone]
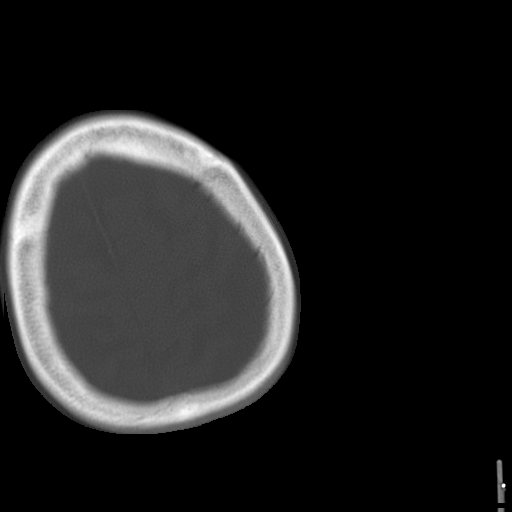

[Series 4: without · axial · non-contrast · 0.44mm/px · z∈[-172,-78]mm · 4 of 33 slices shown (2 of 2)]
[im 7/33  brain]
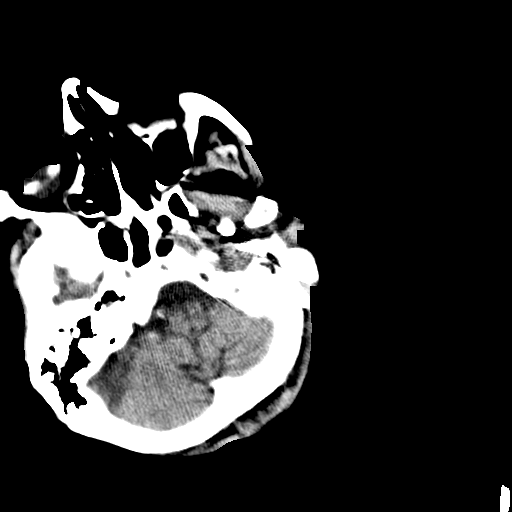
[im 13/33  brain]
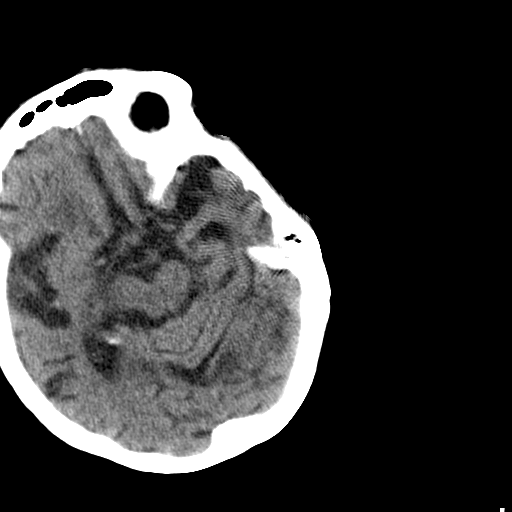
[im 20/33  brain]
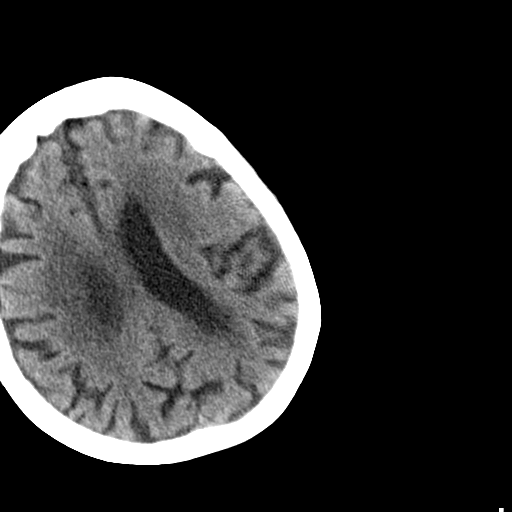
[im 26/33  brain]
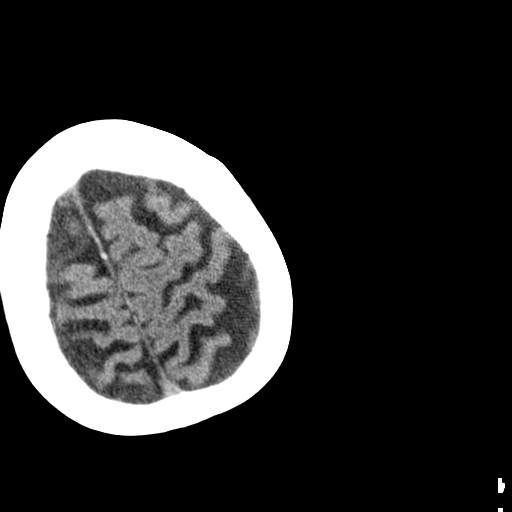

[Series 6: (id) · axial · 0.44mm/px · z∈[-166,-69]mm · 4 of 34 slices shown, 5 images]
[im 7/34  brain]
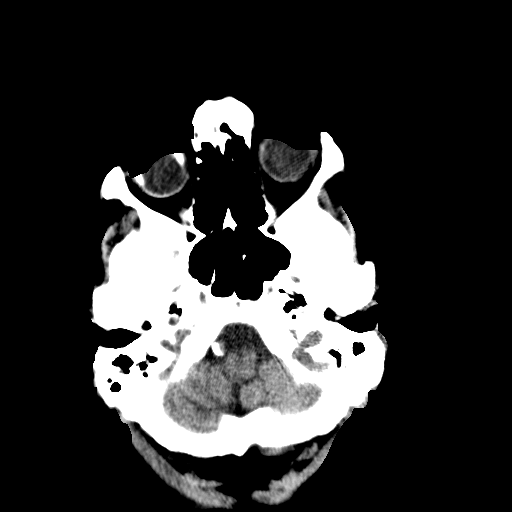
[im 7/34  bone]
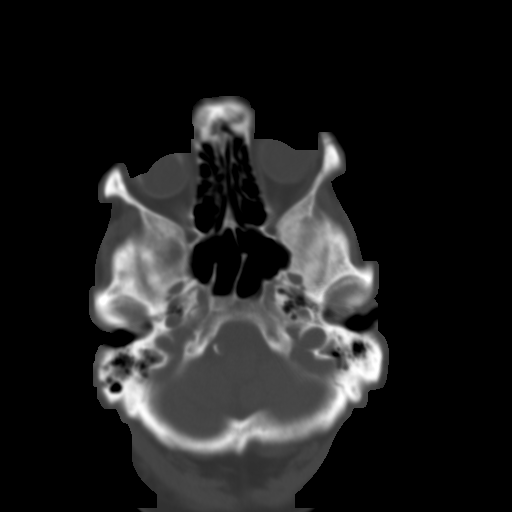
[im 14/34  brain]
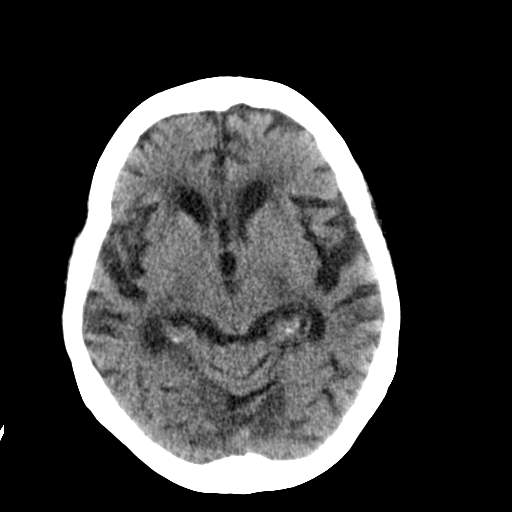
[im 20/34  brain]
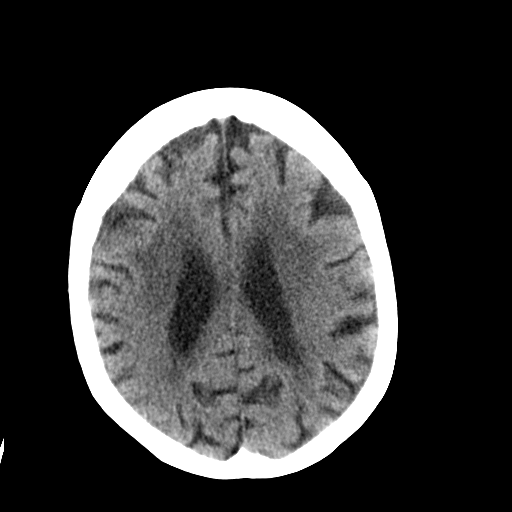
[im 27/34  brain]
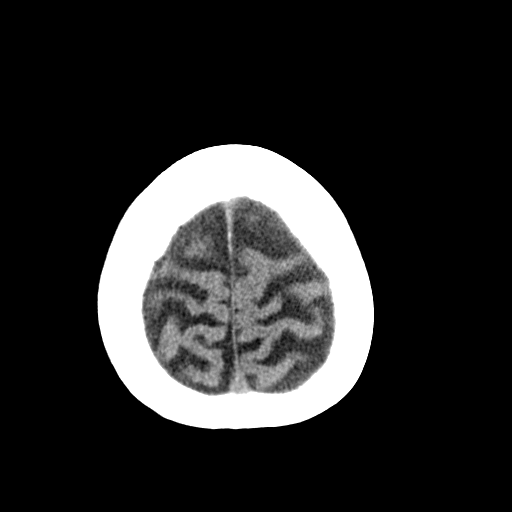

[16 of 30 positions shown; findings below may reference images not displayed]

FINDINGS: There is no evidence of intra-axial nor extra-axial fluid
collections nor evidence of acute hemorrhage. There is diffuse cortical and
cerebellar atrophy as well as diffuse areas of low-attenuation within the
subcortical, deep, and periventricular white matter regions. There is no
evidence of mass effect, nor a depressed skull fracture. The visualized
paranasal sinuses and mastoid air cells are opacified.
IMPRESSION: 1. Chronic and involutional changes without evidence of focal or acute
abnormalities.
2. A preliminary faxed report was relayed to the patient's floor.

## 2014-08-31 NOTE — H&P (Signed)
    Subjective/Chief Complaint Right hip pain    History of Present Illness Patient is an 81 year old woman with dementia who lives independently at home.  She has a caretaker during daytime hours and lives in the house between her son and daughter's homes.  They usually come help her at night and put her to bed.  She was found down this morning by her daytime caretaker.  No evidence of other trauma.  Unsure of loss of consciousness.  Unable to bear weight.    Past History Breast ca 2000 macular degeneration appy cholecystecomy splenectomy dementia    Past Medical Health Cancer, Other    Primary Physician Dewaine Oatsenny Tate   Past Med/Surgical Hx:  Dementia:   spleenectoomy:   ALLERGIES:  No Known Allergies:   HOME MEDICATIONS: Medication Instructions Status  hydrochlorothiazide 25 mg oral tablet 1 tab(s) orally once a day Active  Namenda 10 mg oral tablet 1 tab(s) orally 2 times a day Active   Family and Social History:   Family History Non-Contributory    Social History lives independently with significant assistance    Place of Living Home   Review of Systems:   Subjective/Chief Complaint Right hip pain    Fever/Chills No    Cough No    Sputum No    Abdominal Pain No    Diarrhea No    Constipation No    Nausea/Vomiting No    SOB/DOE No    Chest Pain No    Dysuria possible UTI    Medications/Allergies Reviewed Medications/Allergies reviewed   Physical Exam:   GEN no acute distress, thin    HEENT pale conjunctivae, PERRL, dry oral mucosa, poor dentition    NECK supple  No masses    RESP clear BS  no use of accessory muscles  poor effort    CARD regular rate  no murmur    ABD denies tenderness  denies Flank Tenderness  hypoactive BS    GU foley catheter in place    LYMPH - inguinal    EXTR negative cyanosis/clubbing, negative edema, sensation right LE grossly intact, cap refill <3secs, active dorsiflexion toes and ankle    SKIN abrasion right  elbow    NEURO see extrm exam    PSYCH sedated, responds to pain; does not follow commands    Additional Comments RLE: unable to extend knee past 20 degrees. Skin intact. Moves foot and toes to tactile stimulation. Hip slightly external rotated.     Assessment/Admission Diagnosis Right displaced subcapital femoral fracture    Plan Discussed at length with daughter who is POA and is at bedside with patient the risks of surgical intervention vs. no intervention.  Recommended surgery hemiarthroplasty hip. Aware that with surgery and dementia, risk fo fall and dislocation or perisprosthetic fracture.  Standard surgical risks also discussed, including death. Discussed possibility of girdletone for pain control only. Also spoke about risk of non-op mgmt such as pneumonia, atelectasis and skin breakdown.   Daughter will d/w her siblings.  Leaning towards surgery with hemiarthroplasty. Planned for tomorrow 11am.   NPO p midnight.  Heels elevated. Medical clearance. NWB with strict bedrest. Foley in place.  Bucks traction.   Electronic Signatures: Murlean Harkamasunder, Jesslyn Viglione (MD)  (Signed 10-Oct-13 14:38)  Authored: CHIEF COMPLAINT and HISTORY, PAST MEDICAL/SURGIAL HISTORY, ALLERGIES, HOME MEDICATIONS, FAMILY AND SOCIAL HISTORY, REVIEW OF SYSTEMS, PHYSICAL EXAM, ASSESSMENT AND PLAN   Last Updated: 10-Oct-13 14:38 by Murlean Harkamasunder, Geneve Kimpel (MD)

## 2014-08-31 NOTE — Op Note (Signed)
PATIENT NAME:  Michelle Watts, Michelle K MR#:  [id number] DATE OF BIRTH:  03-18-1926  DATE OF PROCEDURE:  02/22/2012  PREOPERATIVE DIAGNOSIS: Right hip femoral neck fracture, displaced.  POSTOPERATIVE DIAGNOSIS: Right hip femoral neck fracture, displaced.  PROCEDURE PERFORMED: Right hip hemiarthroplasty.  SURGEON: Murlean HarkShalini Temperence Zenor, MD  ASSISTANT: Kennedy BuckerMichael Menz, MD  INTRAVENOUS FLUIDS: 1100 mL.  ESTIMATED BLOOD LOSS: 250 mL.  URINE OUTPUT: 250 mL.   FINDINGS: Displaced femoral neck fracture.  IMPLANT SYSTEM: DePuy Summit femoral stem 12/14 taper (cemented), stem centralizer 11 mm (cemented), size 3 cement restrictor, modular Cathcart tapered spacer 12/14 taper with negative 3 offset,  modular Cathcart fracture head 46 mm outer diameter.  COMPLICATIONS: None noted.  DISPOSITION: Stable to PAC-U.   ANESTHESIA: Spinal.   INDICATION FOR PROCEDURE: Michelle Watts is an 81 year old female who had fallen at home the day prior to admission. She was found down in her home by her caretaker the next morning. She was brought to the emergency room and found to have a femoral neck fracture. She was unable to place weight on her leg. According to her family, she does live in her own home; however, she has a caretaker all day. She has severe dementia and frequently does try to get up in the middle of the night. She has fallen many times in the recent past.   DESCRIPTION OF PROCEDURE: Michelle Watts was brought into the operating room. Spinal anesthesia was administered. She was placed in the operating room table on pegboard, in the lateral decubitus position, with the right side up. Sensation was checked to ensure that the patient did not experience pain. The right lower extremity was prepared and draped in the usual sterile fashion. Landmarks including the greater trochanter, the PIIS and the PSIS were palpated and demarcated. Incision was made. Using Bovie cautery, dissection through the subcutaneous fat was carried  down to the level of the IT band. The IT band was then incised sharply distally. The fibers of the gluteus were bluntly split proximally. A small amount of the gluteus medius was released from the trochanter in order to achieve better exposure. At this time, the bursa overlying the greater trochanter was removed. Using electrocautery, the short external rotators were removed from the posterior aspect of the greater trochanter. Of note, the short external rotators were quite atrophic and deficient in nature. The piriformis was tagged with the capsule. The capsule was cut in a T fashion and both flaps were separately tagged. The femoral neck cut was then modified. At this time, using the acetabular knife, the femoral head was removed. The head was measured at 46 mm. The acetabulum was irrigated and all bony debris was removed.  Attention was now turned to the femur. A box cutter was used to begin femoral canal preparation. The femoral lateralizer was then used followed by sequential broaching. The femur was broached to a size 4. Attempts to broach a size 5 revealed that this would not easily fit within the patient's femoral size. The size 4 broach was removed. The femoral canal was copiously irrigated. A cement restrictor was placed. Cement was pressurized into the femoral canal. The size 4 femoral stem was inserted and held in proper anteversion. All excess cement was removed. At this time, a trial 47 mm head was applied, however, this was found to be extremely tight. A 46 mm head with a minus-3 insert was trialed and found to have good fit. The stem was irrigated and cleaned. The femoral head implant was  applied and the hip was reduced. With the patient's present contractures, she was taken through a range of motion as much as possible. The hip was found to be stable in the position of sleep with the patient's adduction contracture.   The soft tissue was copiously irrigated. The capsule was reapproximated. The  short external rotators were reapproximated. The iliotibial band was then closed using one Vicryl suture. The subcutaneous tissue was closed using 2-0 Vicryl suture. The skin was closed using staples. There was excellent hemostasis. Sterile dressings were applied. The patient was placed in a knee immobilizer as well as an abduction pillow. A flat AP radiograph was obtained in the operating room which demonstrated good alignment of the hip with the hip fully located. The patient was placed in a supine position and taken to the postoperative care unit in stable condition.  ____________________________ Murlean Hark, MD sr:slb D: 02/22/2012 15:46:31 ET T: 02/22/2012 17:01:05 ET JOB#: [id number]  cc: Murlean Hark, MD, <Dictator> Murlean Hark MD ELECTRONICALLY SIGNED 03/17/2012 15:32

## 2014-08-31 NOTE — Discharge Summary (Signed)
PATIENT NAME:  Michelle Watts, Daniela K MR#:  [id number] DATE OF BIRTH:  [birth year]  DATE OF ADMISSION:  02/21/2012 DATE OF DISCHARGE:  02/27/2012  ADMITTING DIAGNOSIS: Right displaced subcapital femoral fracture.   DISCHARGE DIAGNOSIS: Right displaced subcapital femoral fracture.   OPERATION: On 02/22/2012 she had a right hemiarthroplasty with Dr. Murlean HarkShalini Ramasunder.   IV FLUIDS: 1100 mL.   ESTIMATED BLOOD LOSS: 250 mL.   URINE OUTPUT: 250 mL.   IMPLANT SYSTEM: DePuy Summit femoral stem 12/14 taper (cemented), stem centralizer 11 mm (cemented), size 3 cement restrictor, modular Cathcart tapered spacer 12/14 taper with -3 offset, modular Cathcart fracture head 46 mm outer diameter.  COMPLICATIONS: None.   ANESTHESIA: Spinal.    CONDITION FOLLOWING SURGERY: Sent to PAC-U without complication.   HISTORY: Ms. Michelle Watts is an 81 year old female with dementia who lives independently at home. She has a caretaker during the daytime hours and lives in a house between her son and daughter's homes. The usually come and help her at night, put her to bed. She was found down on the morning of 02/21/2012 by her daytime caretaker. Unsure of loss of consciousness and unable to bear weight.   PHYSICAL EXAMINATION: Thin female, no apparent distress. Oriented to person, but not to time or place. HEART: Regular rhythm. LUNGS: Clear to auscultation. MUSCULOSKELETAL: She has pain with hip range of motion and a knee immobilizer on the right leg. SKIN: She has a small 4 cm incision on the lateral aspect of the hip with staples intact and no drainage. A dry dressing is applied. NEUROLOGIC: She is intact to light sensation in the right lower extremity.   PAST MEDICAL HISTORY:  1. Breast cancer.  2. Macular degeneration.  3. Dementia.  4. Cancer.   PAST SURGICAL HISTORY:  1. Cholecystectomy. 2. Splenectomy.   PRIMARY CARE PHYSICIAN: Dr. Dewaine Oatsenny Tate.   DISCHARGE INSTRUCTIONS: Will follow up with [facility] in  two weeks for staple removal. She may be weight bearing as tolerated on the right lower extremity and will need moderate assistance with physical therapy. She can have a regular diet and use TED hose on the bilateral lower extremities. She should use knee immobilizer on the right knee to prevent hip flexion and use an abduction pillow when in bed and in a chair. Dressing change can be once daily or as needed.   DISCHARGE MEDICATIONS: She will resume her home medications and new medications include:  1. Tylenol 650 to 1000 mg q.6 hours p.r.n. pain. 2. Oxycodone 5 mg 1 to 2 tabs p.o. q.4 hours p.r.n. pain. 3. Lovenox 30 mg subcutaneous b.i.d. x3 weeks.   ____________________________ Javier Gell M. Haskel KhanBerndt, NP amb:cms D: 02/27/2012 12:51:41 ET T: 02/27/2012 13:18:53 ET JOB#: [id number]  cc: Andrik Sandt M. Haskel KhanBerndt, NP, <Dictator> Burt EkAPRIL M Tyjuan Demetro FNP ELECTRONICALLY SIGNED 03/26/2012 10:34

## 2014-08-31 NOTE — Consult Note (Signed)
PATIENT NAME:  Michelle Watts, Michelle Watts MR#:  [id number] DATE OF BIRTH:  [birth year]  DATE OF CONSULTATION:  02/24/2012  REFERRING PHYSICIAN:  Auburn Bilberry, MD CONSULTING PHYSICIAN:  Saman Umstead Lizabeth Leyden, MD  REASON FOR CONSULTATION: Acute renal failure.   HISTORY OF PRESENT ILLNESS: The patient is an 81 year old Caucasian female with past medical history of Alzheimer's dementia, osteoporosis, history of atrial fibrillation, breast cancers x2 status post lumpectomies, who presented to [facility] on 02/21/2012 with right hip fracture. After discussing all relevant options, the patient underwent right hip hemiarthroplasty on 02/22/2012 and the patient appears to be doing well from this perspective. We are now consulted for the evaluation and management of acute renal failure. The only labs available to [place] are from this admission. Her presenting creatinine was 1.79. Creatinine is now up to 2.35. The patient was noted to be on hydrochlorothiazide from the nursing home. The patient is also noted as having a urinary tract infection with Escherichia coli. She is currently maintained on levofloxacin for this. The patient is unable to offer any significant amount of history to me at this time as she has underlying advanced dementia. There have been some periods of relatively low blood pressure particularly noted on 10/11. In addition, the patient appears to be becoming progressively anemic suggesting some element of volume loss. The patient's p.o. intake has not been very good per nursing report. She is currently receiving 0.9 normal saline at 125 mL per hour. The patient also received Lasix yesterday.   PAST MEDICAL HISTORY:  1. Alzheimer's dementia.  2. Osteoporosis.  3. History of atrial fibrillation.  4. Breast cancers x2 status post lumpectomies and radiation treatment.   PAST SURGICAL HISTORY:  1. Cholecystectomy.  2. Splenectomy.  3. Tonsillectomy.  4. Breast cancer x2 with lumpectomies.    ALLERGIES: No known drug allergies.   CURRENT INPATIENT MEDICATIONS:  1. Tylenol 500 to 1000 mg p.o. every four hours p.r.n.  2. Bisacodyl suppository 10 mg rectally daily p.r.n. constipation. 3. Docusate senna 1 tablet p.o. b.i.d.  4. Dolasetron 12.5 mg IV every six hours p.r.n.  5. Milk of magnesia 30 mL p.o. b.i.d.  6. Namenda 10 mg b.i.d.  7. Reglan 10 mg p.o. x1.  8. Morphine 1 to 2 mg IV q.2-4 hours p.r.n.  9. Zofran 4 mg IV x1. 10. Oxycodone 5 to 10 mg p.o. every four hours p.r.n.  11. Lovenox 30 mg subcutaneous daily.  12. Levofloxacin 250 mg p.o. every 48 hours.  13. Metoprolol 25 mg p.o. b.i.d.  14. Potassium chloride 20 mEq p.o. b.i.d.   SOCIAL HISTORY: Unable to obtain directly from the patient; however, per dictated history and physical, it appears that there is no reported tobacco, alcohol, or illicit drug use. The patient apparently currently lives alone, but has some caregivers available during the daytime.   FAMILY HISTORY: Significant for mother who had history of breast cancer. Family history also significant for dementia.   REVIEW OF SYSTEMS: Unable to obtain given the patient's underlying dementia and inability to focus on questions.   PHYSICAL EXAMINATION:  VITAL SIGNS: Temperature 100.6, pulse 97, respirations 18, blood pressure 104/50, pulse oximetry 91% on room air.   GENERAL: Michelle Watts Caucasian female currently in no acute distress.   HEENT: Normocephalic, atraumatic. Extraocular movements are intact. Pupils equal, round and reactive to light. No scleral icterus. Conjunctivae are pink. No epistaxis noted. Gross hearing is difficult to assess. Oral mucosa are dry.   NECK: Supple and without JVD.  LUNGS: Clear to auscultation bilaterally currently with normal respiratory effort.   HEART: S1, S2. 2/6 systolic ejection murmur heard. No pericardial friction rub heard.   ABDOMEN: Soft, nontender, nondistended. Bowel sounds positive. No rebound or  guarding. No gross organomegaly appreciated.   EXTREMITIES: Orthopedic device noted on the right lower extremity. There is trace lower extremity edema in the left lower extremity.   NEUROLOGIC: The patient is awake and alert. She is only oriented to self at this time. She does follow simple commands.   SKIN: Warm and dry. Bilateral upper extremity ecchymosis noted.   MUSCULOSKELETAL: Orthopedic device noted on the right lower extremity.   GU: No suprapubic tenderness is noted at this time.   PSYCHIATRIC: Unable to fully assess, but the patient does appear to be pleasant, but has limited insight into her current illness.   LABORATORY DATA: Sodium 145, potassium 3.2, chloride 113, CO2 21, BUN 33, creatinine 2.35, glucose 94, hemoglobin 8.7, magnesium 1.4. Hemoglobin currently 8.7. CK 1,683, CK-MB 6. Troponin 0.05. TSH 2.10. Urine culture shows 100,000 colony-forming units of Escherichia coli and 30,000 colony-forming units of Proteus mirabilis. Urinalysis is negative for protein. Less than one RBC per high-power field.   IMPRESSION/RECOMMENDATIONS:  1. This is an 81 year old Caucasian female who sustained a right hip fracture and also has a past medical history of Alzheimer's dementia, osteoporosis, history of atrial fibrillation, breast cancer, who now appears to have acute renal failure. The patient's acute renal failure is likely multifactorial. Prior drug therapy may be contributing in the form of hydrochlorothiazide. It also appears that the patient has had some volume loss as her blood counts have been drifting down and hemoglobin is currently 8.7. There are also periods of relative hypotension. Most likely, the patient has acute tubular necrosis. We will plan to obtain renal ultrasound, SPEP, UPEP, urine for eosinophils, and urine protein to creatinine ratio. No acute indication for dialysis at this time. I have asked nursing to replace Foley catheter so that we have a better idea of true urine  output.  2. Anemia, not otherwise specified. Hydration may have been contributing to declining hemoglobin, however, blood loss is also a consideration. Current hemoglobin 8.7. Would continue to monitor hemoglobin quite closely and transfuse for hemoglobin of 7 or less.  3. Rhabdomyolysis. CK noted to be high at 1,683. Would recommend continued monitoring of CK and continue IV fluid hydration with 0.9 normal saline at 125 mL/h for now.  4. Hypokalemia/hypomagnesemia. Serum potassium low at 3.2 and magnesium low at 1.4. Agree with repletion efforts of both. Recommend a follow-up serum potassium and magnesium tomorrow.   I would like to thank Dr. Allena KatzPatel for this kind referral. Further plan as the patient progresses.    ____________________________ Lennox PippinsMunsoor N. Lonald Troiani, MD mnl:ap D: 02/24/2012 13:32:26 ET T: 02/24/2012 14:16:58 ET JOB#: [id number]  cc: Lennox PippinsMunsoor N. Keilee Denman, MD, <Dictator> Lennox PippinsMUNSOOR N Matvey Llanas MD ELECTRONICALLY SIGNED 03/25/2012 10:43

## 2014-08-31 NOTE — Consult Note (Signed)
PATIENT NAME:  Michelle Watts, Michelle Watts MR#:  [id number] DATE OF BIRTH:  Feb 25, 1926  DATE OF CONSULTATION:  02/21/2012  REFERRING PHYSICIAN:  Dr. Claris Gladdenamasunder  CONSULTING PHYSICIAN:  Herschell Dimesichard J. Renae GlossWieting, MD  PRIMARY CARE PHYSICIAN:  Dr. Juanetta GoslingHawkins.   REASON FOR CONSULTATION: Hip fracture.   HISTORY OF PRESENT ILLNESS: This is an 81 year old female with dementia who is unable to give much of a history. She had a fall last night. She lives at home with caregivers during the day, but is by herself at night. The caregiver came in this morning and found her lying on the floor close to the front door which is on the way to the bathroom. The patient is unable to recall any of the events that happened and is a very poor historian secondary to dementia. In the ER, she was found to have a hip fracture. She was found to be in acute renal failure and possible urinary tract infection with an elevated white count. Hospitalist services were contacted for further evaluation.   PAST MEDICAL HISTORY:  1. Alzheimer's dementia. Normally able to make it to the bathroom and walks on her own without any devices.  2. Osteoporosis. 3. History of atrial fibrillation. 4. Breast cancer x2 status post lumpectomies and radiation treatments.   PAST SURGICAL HISTORY:  1. Breast cancer x2 lumpectomies. 2. Splenectomy secondary to trauma.  3. Gallbladder. 4. Tonsils. 5. Eye surgery.   ALLERGIES: No known drug allergies.   MEDICATIONS:  1. Namenda 10 mg twice a day. 2. Hydrochlorothiazide 25 mg half-tablet daily.   SOCIAL HISTORY: No smoking. No alcohol. No drug use. She used to work at [employer] for 35 years. Currently lives alone, by herself at night, but has caregivers during the day.   FAMILY HISTORY: Both parents died at old age, 2695, and had dementia. Mother also had breast cancer.   REVIEW OF SYSTEMS: Difficult to obtain secondary to dementia.   PHYSICAL EXAMINATION:    VITAL SIGNS: Temperature 97, pulse 89,  respirations 18, blood pressure 123/72, pulse oximetry 93% on room air.   GENERAL: No respiratory distress.   EYES: Conjunctivae normal. Lids normal. Pupils equal and reactive to light. Difficult to test extraocular muscles with dementia.   EARS, NOSE, MOUTH, AND THROAT: Throat dry, mucous membranes dry. Lips and gums no lesions.   NECK: No JVD. No bruits. No lymphadenopathy. No thyromegaly. No thyroid nodules palpated.   LUNGS: Lungs are clear to auscultation. No use of accessory muscles to breathe. No rhonchi, rales, or wheeze heard.   CARDIOVASCULAR: S1, S2 normal. No gallops or rubs heard. 2/6 systolic ejection murmur. Carotid upstrokes 2+ bilaterally. No bruits. Dorsalis pedis pulses 2+ bilaterally. Trace edema of the lower extremity.   ABDOMEN: Soft, nontender. No organomegaly/splenomegaly. Normoactive bowel sounds. No masses felt.   LYMPHATIC: No lymph nodes in the neck.   MUSCULOSKELETAL: Trace edema. No clubbing. No cyanosis. Right leg positioned up on a pillow.   NEUROLOGIC: The patient is alert, does follow some simple commands like lifting up her arms, but did not wiggle her toes to command. A difficult test with dementia.   PSYCHIATRIC: The patient is alert and follow some simple commands.   LABORATORY, RADIOLOGICAL AND DIAGNOSTIC DATA: Urinalysis: Trace leukocyte esterase, 2+ bacteria, white count 18.6, hemoglobin and hematocrit 11.9 and 36.3, platelet count 258. Right hip shows right femoral fracture. CT scan of the head showed no acute intracranial process. Glucose 150, BUN 32, creatinine 1.79, sodium 139, potassium 3.8, chloride 106, CO2  22, calcium 8.8. GFR 26. Troponin negative. CPK negative. PT 15.5, INR 1.2, PTT 23.1.   ASSESSMENT AND PLAN:  1. Preop consultation for hip fracture. The patient is a moderate risk for surgery. I see no contraindications to surgery at this time. We will give preop beta blocker, low dose metoprolol as long as blood pressure and heart rate  can handle. The case was discussed with both orthopedic and family, that there is a high risk for mortality within the first year of hip fracture, especially if she does not get up and walk again. The only way to get up and walk again as quickly as possible will be going to the Operating Room to fix the hip and this will be to avoid complications such as blood clot, skin breakdown and pneumonia. The family understands this and is willing to undergo the risk. With the patient's history of dementia, the rehab may be more difficult.  2. Acute renal failure: We will give IV fluids and hold hydrochlorothiazide. Hydrochlorothiazide may not be the best medication for this patient.  3. Possible urinary tract infection with positive urinalysis. We will send off a urine culture and start Levaquin at this point. Can stop Levaquin if urine culture is negative.  4. Leukocytosis, could be secondary to urinary tract infection versus being on the floor for an unknown period of time. Will check again in the a.m.  5. History of atrial fibrillation, currently in normal sinus rhythm. Low-dose beta blocker should be cardioprotective and control heart rate.  6. History of hypertension. We will hold the hydrochlorothiazide and do low-dose metoprolol if blood pressure can handle.  7. Dementia, on Namenda. The patient is high risk for delirium during the hospital stay with anesthesia and pain medications after surgery.   The patient is a DO NOT RESUSCITATE.      TIME SPENT ON CONSULTATION: 55 minutes.    ____________________________ Herschell Dimes. Renae Gloss, MD rjw:ap D: 02/21/2012 13:58:02 ET T: 02/21/2012 14:28:34 ET JOB#: [id number]  cc: Herschell Dimes. Renae Gloss, MD, <Dictator> Janeann Forehand., MD Murlean Hark, MD Salley Scarlet MD ELECTRONICALLY SIGNED 02/29/2012 21:12

## 2014-08-31 NOTE — Discharge Summary (Signed)
PATIENT NAME:  Michelle Watts, Michelle Watts MR#:  [id number] DATE OF BIRTH:  01-May-1926  DATE OF ADMISSION:  04/05/2012 DATE OF DISCHARGE:  04/07/2012  CONSULTANTS:  1. Dr. Wynelle LinkKolluru, nephrology.  2. Palliative care.   PRIMARY CARE PHYSICIAN: Dr. Clayborn BignessKatherine Bliss   CHIEF COMPLAINT: Dehydration, altered mental status.   DISCHARGE DIAGNOSES:  1. Altered mental status, likely metabolic encephalopathy from sepsis.  2. Renal failure.  3. Uremia. 4. Dementia. 5. Leukocytosis from enterococcus urinary tract infection.  6. Acute renal failure. 7. Significant dehydration. 8. Hyperkalemia.  9. Severe hypernatremia.  10. Transient hypotension.  11. Positive troponin, likely demand ischemia. 12. Advanced dementia.  13. Multiple chronic ulcers.   DISPOSITION: The patient will be discharged to the Hospice home. She is DO NOT RESUSCITATE.   ACTIVITY: As tolerated.   MEDICATIONS:  1. Roxanol 20 mg/mL, 0.25 to 0.5 mL p.o./SL every 1 to 2 hours p.r.n. for pain or dyspnea until relief.  2. Ativan 0.5 to 1 mg p.o./SL every 2 to 4 hours p.r.n. for agitation/anxiety.  3. Ranitidine 150 mg p.o. b.i.d.  4. ABHR suppository, one per rectum every 4 to 6 hours p.r.n. for non-obstructed refractory nausea and vomiting.   HISTORY OF PRESENT ILLNESS/HOSPITAL COURSE: For full details of the history and physical, please see the dictation on 04/05/2012 by Dr. Elisabeth PigeonVachhani. Briefly, this is an 81 year old female with baseline significant dementia and recent hip fracture status post surgery who was discharged to rehab about a month ago and presented with worsening altered mental status. She was found to have severe dehydration and significant renal failure and BUN greater than 200, severe hypernatremia with sodium greater than 160, hyperkalemia with potassium of 5.5, and GFR of 4. The patient had significant leukocytosis of 24,000 and pan cultures were sent including blood and urine. The patient was started on antibiotic, Levaquin  initially for the presumed urinary tract infection as she had evidence for urinary tract infection in the urinalysis. The patient was started on IV fluids and was relatively hypotensive on arrival and received multiple IV fluids boluses in the ER. Nephrology was consulted. Family did not want dialysis. Her BUN and creatinine have not significantly improved and her altered mental status is not improved either and the family wanted palliative care. At this point she has been seen by the palliative care team as well as the hospice liaison and the family is okay for her to go to the Hospice home. The patient is DO NOT RESUSCITATE.   TOTAL TIME SPENT: 35 minutes.      ____________________________ Krystal EatonShayiq Jaskirat Schwieger, MD sa:bjt D: 04/07/2012 10:38:50 ET T: 04/07/2012 11:05:28 ET JOB#: [id number]  cc: Krystal EatonShayiq Delania Ferg, MD, <Dictator> Burley SaverL. Katherine Bliss, MD Krystal EatonSHAYIQ Micheal Murad MD ELECTRONICALLY SIGNED 04/24/2012 11:09

## 2014-08-31 NOTE — H&P (Signed)
PATIENT NAME:  Michelle Watts, Michelle Watts MR#:  [id number] DATE OF BIRTH:  [birth year]  DATE OF ADMISSION:  04/05/2012  NO DICTATION  ____________________________ Hope PigeonVaibhavkumar G. Elisabeth PigeonVachhani, MD vgv:ap D: 04/05/2012 18:38:08 ET T: 04/06/2012 09:33:49 ET JOB#: [id number]  cc:  Altamese DillingVAIBHAVKUMAR Kylieann Eagles MD ELECTRONICALLY SIGNED 04/28/2012 22:43

## 2014-08-31 NOTE — H&P (Signed)
PATIENT NAME:  Marcos EkeCURRIE, Aleksa K MR#:  [id number] DATE OF BIRTH:  [birth year]  DATE OF ADMISSION:  04/05/2012  REFERRING ER PHYSICIAN: Dorothea GlassmanPaul Malinda, MD  PRIMARY CARE PHYSICIAN: Clayborn BignessKatherine Bliss, MD  REASON FOR ADMISSION: Altered mental status, dehydration.  HISTORY OF PRESENT ILLNESS:  The patient is an 81 year old female with baseline dementia and history of right hip fracture and had hospital admission one month ago and she was sent to an assisted-living facility for further rehabilitation and physical therapy. As the daughter's who are giving all the history right now, she was gradually improving but still totally dependent on help for her day to day activities and feeding, but she noticed that since last two days she has worsening in her mental status and she got a call from the facility yesterday that she is not eating well and not drinking well and today she was very drowsy and had altered mental status so she was brought to the emergency room for further evaluation. On evaluation in the ER, she was found having acute worsening of her renal function and severe dehydration and her UA was positive, so she given for admission for acute urinary tract infection, acute renal failure, and severe dehydration.   REVIEW OF SYSTEMS: Unable to get as the patient is altered mental status. History is obtained from her daughter who is present at bedside.  PAST MEDICAL HISTORY:  1. Alzheimer's dementia. 2. Osteoporosis. 3. Breast cancer.   PAST SURGICAL HISTORY:  1. Breast cancer x2.  2. Lumpectomy. 3. Splenectomy due to trauma. 4. Gallbladder surgery. 5. Tonsillectomy.  6. Appendectomy.   ALLERGIES: No known drug allergies.   MEDICATIONS: Namenda and hydrochlorothiazide.   SOCIAL HISTORY: Denies smoking, denies alcohol use, and denies any drug use in the past.   FAMILY HISTORY: Both parents died of old age, in their 7490s, and her sisters had breast cancer.  PHYSICAL EXAMINATION:  VITALS:  Temperature 98.6, pulse rate 87, respirations 20, blood pressure 116/42, and pulse oximetry 98 on room air.   GENERAL APPEARANCE: Cachectic and dry.   HEAD AND NECK: Sclerae anicteric. Conjunctivae pale. Oral mucosa dry. Poor oral hygiene. Trachea midline. She is lethargic but arousable to touch and stimuli.   RESPIRATORY: Bilateral clear and equal air entry.   CARDIOVASCULAR: S1 and S2 present, regular. No murmurs appreciated.   ABDOMEN: Soft and nontender. Bowel sounds present No costovertebral angle tenderness.   LIMBS: No joint swelling or tenderness appreciated.   SKIN: No rashes. No pedal edema. Pneumatic compression device present on both lower limbs.   NEUROLOGICAL: She is lethargic but follows simple commands on arousal and feels very weak. She is able to more toes on both lower limbs and moves her hands and fingers slowly on asking. Skin turgor delayed.   RESULTS: Glucose 141, BUN 232, creatinine 8.9, sodium 160, potassium 5.5, chloride 128, CO2 19, calcium 9.4, total protein 8.7, albumin 2.2, bilirubin 0.4, alkaline phosphatase 103, SGOT 53, and SGPT 23. Troponin 0.32. WBC 24.8, RBC 3.22, hemoglobin 9.6, and platelet count 375.   Urinalysis: Yellow, turbid urine with leukocyte esterase 3+ and WBCs in the urine, 4368.   Chest x-ray: Stable cardiomegaly, emphysematous lung disease.   ASSESSMENT: Altered mental status due to electrolyte imbalance, dehydration, and urinary tract infection.  1. Urinary tract infection. We will give Levaquin IV, as started by ER. We will get urine culture and blood culture for further evaluation in adjusting antibiotic. We will also get pharmacy consult to adjust the dose of Levaquin,  according to her renal function.  2. Dehydration. She has received 2 liters of normal saline so far by the ER and, as per the daughter who is in the room, she has some improvement in her mental status and she is opening eyes and smiling to the daughter now. We will  continue D5 half NS overnight and we will check BMP in the morning. We will also place and monitor her urine output by a Foley. 3. Hyperkalemia, due to acute renal failure, potassium is 5.5, no significant EKG changes, so we will not treat this at this time. We will follow tomorrow morning.  4. Hypernatremia and hyperchloremia secondary to dehydration. We will give IV fluids and recheck in the morning.  5. Acute on chronic renal failure. Her baseline creatinine was 1.8 to 1.9, as per the last month admission in the hospital, which is now 8.1, so possibly this is due to dehydration. We will rehydrate her and see the improvement in her renal function at this point of time.  6. Elevated troponin. This is possibly due to infection and dehydration with acute renal failure. She is not a candidate for any extensive cardiac work-up at this point. We will not entertain a cardiac diagnosis at this point, we will just follow one more troponin to see the direction it is going.  7. Functional decline, altered mental status, and physical dependency. We will get physical therapy consult and care management help to decide about her placement once she is medically stable.  8. Deep vein thrombosis prophylaxis with heparin sub-Q.  9. GI prophylaxis with IV Nexium.  10. History of hypertension. We will not give any blood pressure medication at this point as she is dehydrated.  11. History of dementia. She was taking Namenda at home. We will not give that medicine at this point and see the improvement in her mental status with rehydration and treatment of infection.      12. CODE STATUS: DO NOT RESUSCITATE. Healthcare proxy is her daughter and she says all her documents are in the system from her last admission in the hospital. The patient's condition, treatment plan, and findings were explained to the family in detail   TOTAL TIME SPENT: 55 minutes.   ____________________________ Hope Pigeon Elisabeth Pigeon,  MD vgv:slb D: 04/05/2012 18:37:55 ET T: 04/06/2012 09:07:53 ET JOB#: [id number]  cc: Hope Pigeon. Elisabeth Pigeon, MD, <Dictator> Burley Saver, MD Altamese Dilling MD ELECTRONICALLY SIGNED 04/28/2012 22:43
